# Patient Record
Sex: Female | Born: 1940 | Race: White | Hispanic: No | Smoking: Never smoker
Health system: Southern US, Community
[De-identification: ages and names within clinical notes are randomized; demographics above are authoritative.]

## PROBLEM LIST (undated history)

## (undated) DIAGNOSIS — I10 Essential (primary) hypertension: Secondary | ICD-10-CM

---

## 2017-11-02 ENCOUNTER — Emergency Department (HOSPITAL_BASED_OUTPATIENT_CLINIC_OR_DEPARTMENT_OTHER): Payer: No Typology Code available for payment source

## 2017-11-02 ENCOUNTER — Emergency Department (HOSPITAL_BASED_OUTPATIENT_CLINIC_OR_DEPARTMENT_OTHER)
Admission: EM | Admit: 2017-11-02 | Discharge: 2017-11-02 | Disposition: A | Discharge status: Home / Self Care | Payer: No Typology Code available for payment source | Attending: Emergency Medicine | Admitting: Emergency Medicine

## 2017-11-02 ENCOUNTER — Encounter (HOSPITAL_BASED_OUTPATIENT_CLINIC_OR_DEPARTMENT_OTHER): Payer: Self-pay

## 2017-11-02 ENCOUNTER — Other Ambulatory Visit: Payer: Self-pay

## 2017-11-02 DIAGNOSIS — Z79899 Other long term (current) drug therapy: Secondary | ICD-10-CM | POA: Insufficient documentation

## 2017-11-02 DIAGNOSIS — Y9389 Activity, other specified: Secondary | ICD-10-CM | POA: Insufficient documentation

## 2017-11-02 DIAGNOSIS — Y999 Unspecified external cause status: Secondary | ICD-10-CM | POA: Diagnosis not present

## 2017-11-02 DIAGNOSIS — R079 Chest pain, unspecified: Secondary | ICD-10-CM | POA: Diagnosis not present

## 2017-11-02 DIAGNOSIS — S6992XA Unspecified injury of left wrist, hand and finger(s), initial encounter: Secondary | ICD-10-CM | POA: Diagnosis present

## 2017-11-02 DIAGNOSIS — E876 Hypokalemia: Secondary | ICD-10-CM | POA: Insufficient documentation

## 2017-11-02 DIAGNOSIS — S6292XA Unspecified fracture of left wrist and hand, initial encounter for closed fracture: Secondary | ICD-10-CM | POA: Insufficient documentation

## 2017-11-02 DIAGNOSIS — I10 Essential (primary) hypertension: Secondary | ICD-10-CM | POA: Diagnosis not present

## 2017-11-02 DIAGNOSIS — Y92410 Unspecified street and highway as the place of occurrence of the external cause: Secondary | ICD-10-CM | POA: Diagnosis not present

## 2017-11-02 HISTORY — DX: Essential (primary) hypertension: I10

## 2017-11-02 LAB — BASIC METABOLIC PANEL
ANION GAP: 9 (ref 5–15)
BUN: 12 mg/dL (ref 6–20)
CALCIUM: 9.8 mg/dL (ref 8.9–10.3)
CO2: 30 mmol/L (ref 22–32)
CREATININE: 0.82 mg/dL (ref 0.44–1.00)
Chloride: 92 mmol/L — ABNORMAL LOW (ref 101–111)
GFR calc Af Amer: >60 mL/min (ref >60)
GLUCOSE: 105 mg/dL — AB (ref 65–99)
Potassium: 2.7 mmol/L — CL (ref 3.5–5.1)
Sodium: 131 mmol/L — ABNORMAL LOW (ref 135–145)

## 2017-11-02 LAB — CBC WITH DIFFERENTIAL/PLATELET
BASOS ABS: 0 10*3/uL (ref 0.0–0.1)
BASOS PCT: 0 %
EOS PCT: 2 %
Eosinophils Absolute: 0.1 10*3/uL (ref 0.0–0.7)
HEMATOCRIT: 42.7 % (ref 36.0–46.0)
Hemoglobin: 15.1 g/dL — ABNORMAL HIGH (ref 12.0–15.0)
Lymphocytes Relative: 17 %
Lymphs Abs: 1.3 10*3/uL (ref 0.7–4.0)
MCH: 34.5 pg — ABNORMAL HIGH (ref 26.0–34.0)
MCHC: 35.4 g/dL (ref 30.0–36.0)
MCV: 97.5 fL (ref 78.0–100.0)
MONO ABS: 0.5 10*3/uL (ref 0.1–1.0)
MONOS PCT: 7 %
NEUTROS ABS: 5.5 10*3/uL (ref 1.7–7.7)
Neutrophils Relative %: 74 %
PLATELETS: 154 10*3/uL (ref 150–400)
RBC: 4.38 MIL/uL (ref 3.87–5.11)
RDW: 11.9 % (ref 11.5–15.5)
WBC: 7.4 10*3/uL (ref 4.0–10.5)

## 2017-11-02 MED ORDER — ACETAMINOPHEN 325 MG PO TABS
650.0000 mg | ORAL_TABLET | Freq: Once | ORAL | Status: AC
  Administered 2017-11-02: 650 mg via ORAL
  Filled 2017-11-02: qty 2

## 2017-11-02 MED ORDER — POTASSIUM CHLORIDE CRYS ER 20 MEQ PO TBCR
40.0000 meq | EXTENDED_RELEASE_TABLET | Freq: Once | ORAL | Status: AC
  Administered 2017-11-02: 40 meq via ORAL
  Filled 2017-11-02: qty 2

## 2017-11-02 MED ORDER — IOPAMIDOL (ISOVUE-300) INJECTION 61%
100.0000 mL | Freq: Once | INTRAVENOUS | Status: AC | PRN
  Administered 2017-11-02: 80 mL via INTRAVENOUS

## 2017-11-02 MED ORDER — HYDROCODONE-ACETAMINOPHEN 5-325 MG PO TABS
1.0000 | ORAL_TABLET | Freq: Once | ORAL | Status: DC
  Filled 2017-11-02: qty 1

## 2017-11-02 MED ORDER — HYDROCODONE-ACETAMINOPHEN 5-325 MG PO TABS: 1.0000 | ORAL_TABLET | Freq: Four times a day (QID) | ORAL | 0 refills | Status: AC | PRN

## 2017-11-02 MED ORDER — POTASSIUM CHLORIDE CRYS ER 20 MEQ PO TBCR: 20.0000 meq | EXTENDED_RELEASE_TABLET | Freq: Every day | ORAL | 0 refills | Status: AC

## 2017-11-02 NOTE — ED Notes (Signed)
Pt refused pain meds.
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
## 2017-11-02 NOTE — ED Notes (Signed)
Pt to XR at this time
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
